# Patient Record
Sex: Female | Born: 1965 | Race: White | Hispanic: No | Marital: Married | Smoking: Never smoker
Health system: Southern US, Community
[De-identification: ages and names within clinical notes are randomized; demographics above are authoritative.]

## PROBLEM LIST (undated history)

## (undated) DIAGNOSIS — E039 Hypothyroidism, unspecified: Secondary | ICD-10-CM

## (undated) DIAGNOSIS — N200 Calculus of kidney: Secondary | ICD-10-CM

## (undated) HISTORY — PX: COLON SURGERY: SHX602

---

## 2013-01-03 ENCOUNTER — Encounter (HOSPITAL_BASED_OUTPATIENT_CLINIC_OR_DEPARTMENT_OTHER): Payer: Self-pay | Admitting: Emergency Medicine

## 2013-01-03 ENCOUNTER — Emergency Department (HOSPITAL_BASED_OUTPATIENT_CLINIC_OR_DEPARTMENT_OTHER)
Admission: EM | Admit: 2013-01-03 | Discharge: 2013-01-04 | Disposition: A | Discharge status: Home / Self Care | Payer: BC Managed Care – PPO | Attending: Emergency Medicine | Admitting: Emergency Medicine

## 2013-01-03 DIAGNOSIS — M545 Low back pain, unspecified: Secondary | ICD-10-CM | POA: Insufficient documentation

## 2013-01-03 DIAGNOSIS — M549 Dorsalgia, unspecified: Secondary | ICD-10-CM

## 2013-01-03 DIAGNOSIS — Z8639 Personal history of other endocrine, nutritional and metabolic disease: Secondary | ICD-10-CM | POA: Insufficient documentation

## 2013-01-03 DIAGNOSIS — Z862 Personal history of diseases of the blood and blood-forming organs and certain disorders involving the immune mechanism: Secondary | ICD-10-CM | POA: Insufficient documentation

## 2013-01-03 DIAGNOSIS — R51 Headache: Secondary | ICD-10-CM | POA: Insufficient documentation

## 2013-01-03 DIAGNOSIS — Z87442 Personal history of urinary calculi: Secondary | ICD-10-CM | POA: Insufficient documentation

## 2013-01-03 HISTORY — DX: Hypothyroidism, unspecified: E03.9

## 2013-01-03 HISTORY — DX: Calculus of kidney: N20.0

## 2013-01-03 NOTE — ED Notes (Signed)
PT sts headache which starts at base of posterior head and radiates up to top of head and to R eye; described as sharp; sts it si worse when sitting up; but also hurting so when lying that she can't sleep. Also c/o lower back pain starting this am. Pt sts chills at home. +Nausea, denies vomiting/diarrhea.

## 2013-01-04 ENCOUNTER — Emergency Department (HOSPITAL_BASED_OUTPATIENT_CLINIC_OR_DEPARTMENT_OTHER): Payer: BC Managed Care – PPO

## 2013-01-04 LAB — COMPREHENSIVE METABOLIC PANEL
ALT: 21 U/L (ref 0–35)
AST: 21 U/L (ref 0–37)
Albumin: 3.7 g/dL (ref 3.5–5.2)
Alkaline Phosphatase: 57 U/L (ref 39–117)
Chloride: 102 mEq/L (ref 96–112)
GFR calc Af Amer: >90 mL/min (ref >90)
Potassium: 3.2 mEq/L — ABNORMAL LOW (ref 3.5–5.1)
Sodium: 138 mEq/L (ref 135–145)
Total Bilirubin: 0.7 mg/dL (ref 0.3–1.2)
Total Protein: 6.7 g/dL (ref 6.0–8.3)

## 2013-01-04 LAB — CBC WITH DIFFERENTIAL/PLATELET
Basophils Absolute: 0 10*3/uL (ref 0.0–0.1)
Basophils Relative: 0 % (ref 0–1)
Hemoglobin: 11.7 g/dL — ABNORMAL LOW (ref 12.0–15.0)
Lymphocytes Relative: 20 % (ref 12–46)
MCHC: 32.1 g/dL (ref 30.0–36.0)
Monocytes Relative: 10 % (ref 3–12)
Neutro Abs: 2.5 10*3/uL (ref 1.7–7.7)
Neutrophils Relative %: 69 % (ref 43–77)
Platelets: 192 10*3/uL (ref 150–400)
RBC: 4.42 MIL/uL (ref 3.87–5.11)
RDW: 15.8 % — ABNORMAL HIGH (ref 11.5–15.5)

## 2013-01-04 LAB — URINALYSIS, ROUTINE W REFLEX MICROSCOPIC
Glucose, UA: NEGATIVE mg/dL
Hgb urine dipstick: NEGATIVE
Ketones, ur: NEGATIVE mg/dL
Leukocytes, UA: NEGATIVE
Nitrite: NEGATIVE
Protein, ur: NEGATIVE mg/dL
pH: 6.5 (ref 5.0–8.0)

## 2013-01-04 MED ORDER — DIPHENHYDRAMINE HCL 50 MG/ML IJ SOLN
25.0000 mg | Freq: Once | INTRAMUSCULAR | Status: AC
  Administered 2013-01-04: 25 mg via INTRAVENOUS
  Filled 2013-01-04: qty 1

## 2013-01-04 MED ORDER — DEXAMETHASONE SODIUM PHOSPHATE 10 MG/ML IJ SOLN
10.0000 mg | Freq: Once | INTRAMUSCULAR | Status: AC
  Administered 2013-01-04: 10 mg via INTRAVENOUS
  Filled 2013-01-04: qty 1

## 2013-01-04 MED ORDER — METOCLOPRAMIDE HCL 5 MG/ML IJ SOLN
10.0000 mg | Freq: Once | INTRAMUSCULAR | Status: AC
  Administered 2013-01-04: 10 mg via INTRAVENOUS
  Filled 2013-01-04: qty 2

## 2013-01-04 MED ORDER — KETOROLAC TROMETHAMINE 30 MG/ML IJ SOLN
30.0000 mg | Freq: Once | INTRAMUSCULAR | Status: AC
  Administered 2013-01-04: 30 mg via INTRAVENOUS
  Filled 2013-01-04: qty 1

## 2013-01-04 MED ORDER — SODIUM CHLORIDE 0.9 % IV BOLUS (SEPSIS)
1000.0000 mL | Freq: Once | INTRAVENOUS | Status: AC
  Administered 2013-01-04: 1000 mL via INTRAVENOUS

## 2013-01-04 NOTE — Discharge Instructions (Signed)
Ibuprofen 600 mg every 6 hours as needed for pain.  Return to the emergency department if her symptoms significantly worsen you develop high fevers, stiff neck, or other new or concerning symptoms.   Headaches, Frequently Asked Questions MIGRAINE HEADACHES Q: What is migraine? What causes it? How can I treat it? A: Generally, migraine headaches begin as a dull ache. Then they develop into a constant, throbbing, and pulsating pain. You may experience pain at the temples. You may experience pain at the front or back of one or both sides of the head. The pain is usually accompanied by a combination of:  Nausea.  Vomiting.  Sensitivity to light and noise. Some people (about 15%) experience an aura (see below) before an attack. The cause of migraine is believed to be chemical reactions in the brain. Treatment for migraine may include over-the-counter or prescription medications. It may also include self-help techniques. These include relaxation training and biofeedback.  Q: What is an aura? A: About 15% of people with migraine get an "aura". This is a sign of neurological symptoms that occur before a migraine headache. You may see wavy or jagged lines, dots, or flashing lights. You might experience tunnel vision or blind spots in one or both eyes. The aura can include visual or auditory hallucinations (something imagined). It may include disruptions in smell (such as strange odors), taste or touch. Other symptoms include:  Numbness.  A "pins and needles" sensation.  Difficulty in recalling or speaking the correct word. These neurological events may last as long as 60 minutes. These symptoms will fade as the headache begins. Q: What is a trigger? A: Certain physical or environmental factors can lead to or "trigger" a migraine. These include:  Foods.  Hormonal changes.  Weather.  Stress. It is important to remember that triggers are different for everyone. To help prevent migraine  attacks, you need to figure out which triggers affect you. Keep a headache diary. This is a good way to track triggers. The diary will help you talk to your healthcare professional about your condition. Q: Does weather affect migraines? A: Bright sunshine, hot, humid conditions, and drastic changes in barometric pressure may lead to, or "trigger," a migraine attack in some people. But studies have shown that weather does not act as a trigger for everyone with migraines. Q: What is the link between migraine and hormones? A: Hormones start and regulate many of your body's functions. Hormones keep your body in balance within a constantly changing environment. The levels of hormones in your body are unbalanced at times. Examples are during menstruation, pregnancy, or menopause. That can lead to a migraine attack. In fact, about three quarters of all women with migraine report that their attacks are related to the menstrual cycle.  Q: Is there an increased risk of stroke for migraine sufferers? A: The likelihood of a migraine attack causing a stroke is very remote. That is not to say that migraine sufferers cannot have a stroke associated with their migraines. In persons under age 47, the most common associated factor for stroke is migraine headache. But over the course of a person's normal life span, the occurrence of migraine headache may actually be associated with a reduced risk of dying from cerebrovascular disease due to stroke.  Q: What are acute medications for migraine? A: Acute medications are used to treat the pain of the headache after it has started. Examples over-the-counter medications, NSAIDs, ergots, and triptans.  Q: What are the triptans? A: Triptans  are the newest class of abortive medications. They are specifically targeted to treat migraine. Triptans are vasoconstrictors. They moderate some chemical reactions in the brain. The triptans work on receptors in your brain. Triptans help to  restore the balance of a neurotransmitter called serotonin. Fluctuations in levels of serotonin are thought to be a main cause of migraine.  Q: Are over-the-counter medications for migraine effective? A: Over-the-counter, or "OTC," medications may be effective in relieving mild to moderate pain and associated symptoms of migraine. But you should see your caregiver before beginning any treatment regimen for migraine.  Q: What are preventive medications for migraine? A: Preventive medications for migraine are sometimes referred to as "prophylactic" treatments. They are used to reduce the frequency, severity, and length of migraine attacks. Examples of preventive medications include antiepileptic medications, antidepressants, beta-blockers, calcium channel blockers, and NSAIDs (nonsteroidal anti-inflammatory drugs). Q: Why are anticonvulsants used to treat migraine? A: During the past few years, there has been an increased interest in antiepileptic drugs for the prevention of migraine. They are sometimes referred to as "anticonvulsants". Both epilepsy and migraine may be caused by similar reactions in the brain.  Q: Why are antidepressants used to treat migraine? A: Antidepressants are typically used to treat people with depression. They may reduce migraine frequency by regulating chemical levels, such as serotonin, in the brain.  Q: What alternative therapies are used to treat migraine? A: The term "alternative therapies" is often used to describe treatments considered outside the scope of conventional Western medicine. Examples of alternative therapy include acupuncture, acupressure, and yoga. Another common alternative treatment is herbal therapy. Some herbs are believed to relieve headache pain. Always discuss alternative therapies with your caregiver before proceeding. Some herbal products contain arsenic and other toxins. TENSION HEADACHES Q: What is a tension-type headache? What causes it? How can I  treat it? A: Tension-type headaches occur randomly. They are often the result of temporary stress, anxiety, fatigue, or anger. Symptoms include soreness in your temples, a tightening band-like sensation around your head (a "vice-like" ache). Symptoms can also include a pulling feeling, pressure sensations, and contracting head and neck muscles. The headache begins in your forehead, temples, or the back of your head and neck. Treatment for tension-type headache may include over-the-counter or prescription medications. Treatment may also include self-help techniques such as relaxation training and biofeedback. CLUSTER HEADACHES Q: What is a cluster headache? What causes it? How can I treat it? A: Cluster headache gets its name because the attacks come in groups. The pain arrives with little, if any, warning. It is usually on one side of the head. A tearing or bloodshot eye and a runny nose on the same side of the headache may also accompany the pain. Cluster headaches are believed to be caused by chemical reactions in the brain. They have been described as the most severe and intense of any headache type. Treatment for cluster headache includes prescription medication and oxygen. SINUS HEADACHES Q: What is a sinus headache? What causes it? How can I treat it? A: When a cavity in the bones of the face and skull (a sinus) becomes inflamed, the inflammation will cause localized pain. This condition is usually the result of an allergic reaction, a tumor, or an infection. If your headache is caused by a sinus blockage, such as an infection, you will probably have a fever. An x-ray will confirm a sinus blockage. Your caregiver's treatment might include antibiotics for the infection, as well as antihistamines or decongestants.  REBOUND HEADACHES Q: What is a rebound headache? What causes it? How can I treat it? A: A pattern of taking acute headache medications too often can lead to a condition known as "rebound  headache." A pattern of taking too much headache medication includes taking it more than 2 days per week or in excessive amounts. That means more than the label or a caregiver advises. With rebound headaches, your medications not only stop relieving pain, they actually begin to cause headaches. Doctors treat rebound headache by tapering the medication that is being overused. Sometimes your caregiver will gradually substitute a different type of treatment or medication. Stopping may be a challenge. Regularly overusing a medication increases the potential for serious side effects. Consult a caregiver if you regularly use headache medications more than 2 days per week or more than the label advises. ADDITIONAL QUESTIONS AND ANSWERS Q: What is biofeedback? A: Biofeedback is a self-help treatment. Biofeedback uses special equipment to monitor your body's involuntary physical responses. Biofeedback monitors:  Breathing.  Pulse.  Heart rate.  Temperature.  Muscle tension.  Brain activity. Biofeedback helps you refine and perfect your relaxation exercises. You learn to control the physical responses that are related to stress. Once the technique has been mastered, you do not need the equipment any more. Q: Are headaches hereditary? A: Four out of five (80%) of people that suffer report a family history of migraine. Scientists are not sure if this is genetic or a family predisposition. Despite the uncertainty, a child has a 50% chance of having migraine if one parent suffers. The child has a 75% chance if both parents suffer.  Q: Can children get headaches? A: By the time they reach high school, most young people have experienced some type of headache. Many safe and effective approaches or medications can prevent a headache from occurring or stop it after it has begun.  Q: What type of doctor should I see to diagnose and treat my headache? A: Start with your primary caregiver. Discuss his or her  experience and approach to headaches. Discuss methods of classification, diagnosis, and treatment. Your caregiver may decide to recommend you to a headache specialist, depending upon your symptoms or other physical conditions. Having diabetes, allergies, etc., may require a more comprehensive and inclusive approach to your headache. The National [employer] Foundation will provide, upon request, a list of [facility] physician members in your state. Document Released: 03/21/2003 Document Revised: 03/23/2011 Document Reviewed: 08/29/2007 [facility] Patient Information 2014 [place].

## 2013-01-04 NOTE — ED Provider Notes (Signed)
CSN: [id number]     Arrival date & time 01/03/13  2303 History   First MD Initiated Contact with Patient 01/04/13 0000     Chief Complaint  Patient presents with  . Headache   (Consider location/radiation/quality/duration/timing/severity/associated sxs/prior Treatment) HPI Comments: Patient is a 47 year old female is little significant medical history. She presents today with complaints of headache that has been occurring off and on for the past week and substantially worsened today. Her discomfort is located in the back of her head and radiates into her neck and top of her head. She denies any injury or trauma. She denies any visual complaints. She feels nauseated but denies any vomiting she has no fever and no stiff neck   Patient is a 47 y.o. female presenting with headaches. The history is provided by the patient.  Headache Pain location:  Occipital Quality:  Stabbing Radiates to:  Does not radiate Onset quality:  Gradual Duration:  2 days Timing:  Constant Progression:  Worsening Chronicity:  New Similar to prior headaches: no   Context: activity and bright light     Past Medical History  Diagnosis Date  . Kidney stone   . Hypothyroid    Past Surgical History  Procedure Laterality Date  . Colon surgery     No family history on file. History  Substance Use Topics  . Smoking status: Never Smoker   . Smokeless tobacco: Not on file  . Alcohol Use: No   OB History   Grav Para Term Preterm Abortions TAB SAB Ect Mult Living                 Review of Systems  Neurological: Positive for headaches.  All other systems reviewed and are negative.    Allergies  Stadol and Toradol  Home Medications  No current outpatient prescriptions on file. BP 146/103  Pulse 96  Temp(Src) 98.7 F (37.1 C) (Oral)  Resp 20  Ht 5\' 7"  (1.702 m)  Wt 173 lb (78.472 kg)  BMI 27.09 kg/m2  SpO2 98% Physical Exam  Nursing note and vitals reviewed. Constitutional: She is oriented to  person, place, and time. She appears well-developed and well-nourished. No distress.  HENT:  Head: Normocephalic and atraumatic.  Eyes: EOM are normal. Pupils are equal, round, and reactive to light.  There is no papilledema.  Neck: Normal range of motion. Neck supple.  Cardiovascular: Normal rate and regular rhythm.  Exam reveals no gallop and no friction rub.   No murmur heard. Pulmonary/Chest: Effort normal and breath sounds normal. No respiratory distress. She has no wheezes.  Abdominal: Soft. Bowel sounds are normal. She exhibits no distension. There is no tenderness.  Musculoskeletal: Normal range of motion. She exhibits no edema.  Lymphadenopathy:    She has no cervical adenopathy.  Neurological: She is alert and oriented to person, place, and time. No cranial nerve deficit. She exhibits normal muscle tone. Coordination normal.  Skin: Skin is warm and dry. She is not diaphoretic.    ED Course  Procedures (including critical care time) Labs Review Labs Reviewed  CBC WITH DIFFERENTIAL  COMPREHENSIVE METABOLIC PANEL  URINALYSIS, ROUTINE W REFLEX MICROSCOPIC   Imaging Review No results found.  EKG Interpretation    Date/Time:  Wednesday January 04 2013 00:24:28 EST Ventricular Rate:  66 PR Interval:  172 QRS Duration: 112 QT Interval:  432 QTC Calculation: 452 R Axis:   56 Text Interpretation:  Normal sinus rhythm with sinus arrhythmia Incomplete right bundle branch block Borderline  ECG Confirmed by DELOS  MD, Trayquan Kolakowski ([id number]) on 01/04/2013 2:06:49 AM            MDM  No diagnosis found. Patient is a 47 year old female presents with headache. Her neurologic exam is nonfocal however this is a very unusual headache for her. CT scan is unremarkable and blood work is unremarkable as well. She is feeling better with a migraine cocktail and I believe stable for discharge. She will return if her symptoms worsen or change.    Geoffery Lyons, MD 01/04/13 [phone]

## 2013-01-04 NOTE — ED Notes (Signed)
Ha off and on x 1 week w nausea

## 2015-07-23 IMAGING — CT CT HEAD W/O CM
1 series · 16 of 30 positions shown, 20 images · non-contrast
Comparison: None available

CLINICAL DATA: Severe headache

EXAM:
CT HEAD WITHOUT CONTRAST
TECHNIQUE: Contiguous axial images were obtained from the base of the skull
through the vertex without intravenous contrast.

[Series 2: head 4.8 h37s · axial · 0.45mm/px · z∈[-184,-51]mm · 16 of 32 slices shown, 20 images]
[im 2/32  brain]
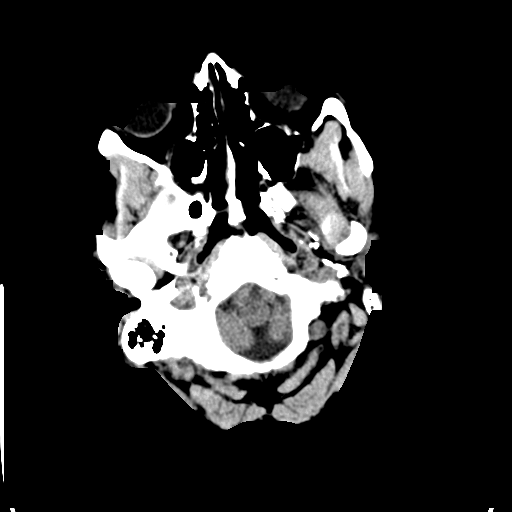
[im 2/32  bone]
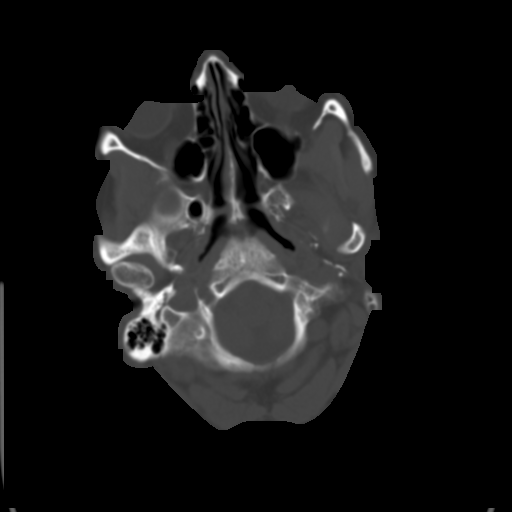
[im 4/32  brain]
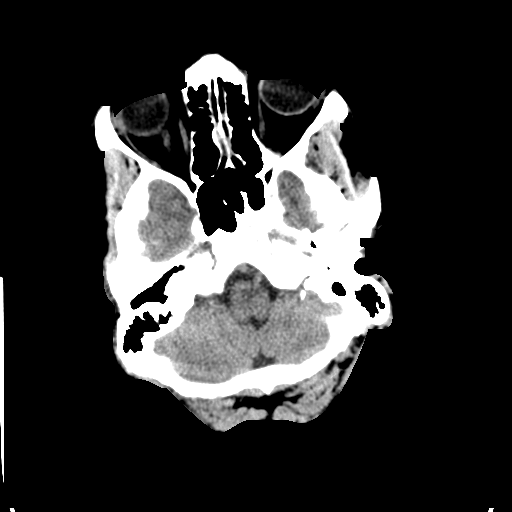
[im 6/32  brain]
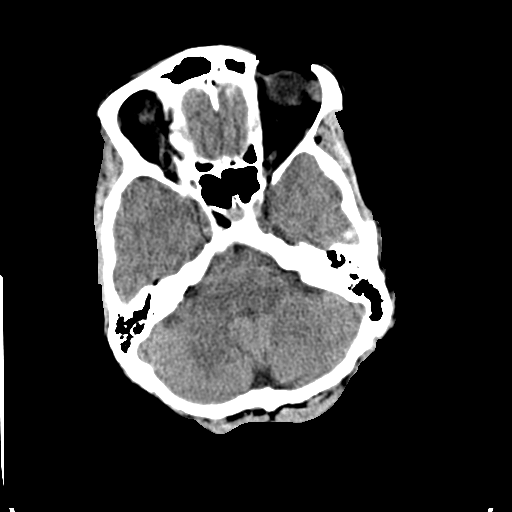
[im 8/32  brain]
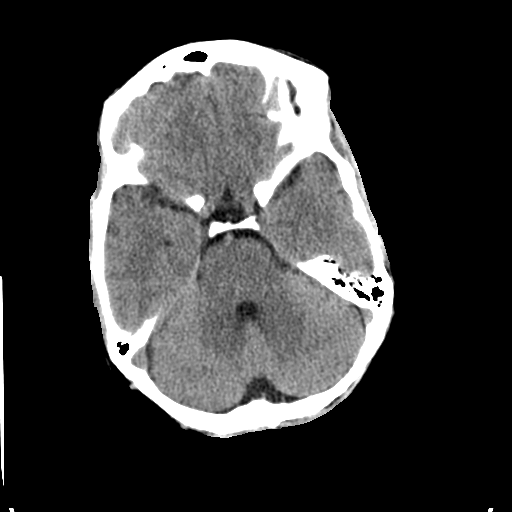
[im 9/32  brain]
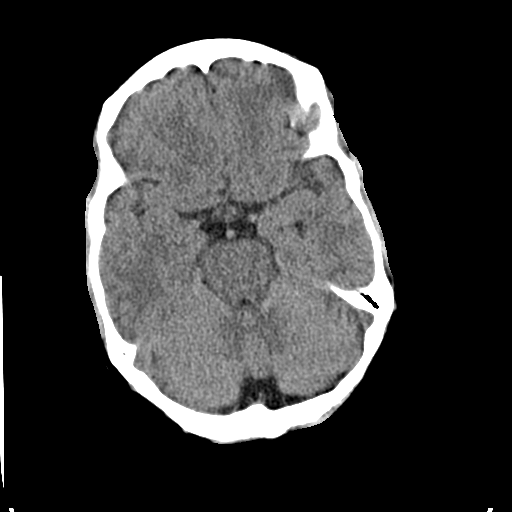
[im 9/32  bone]
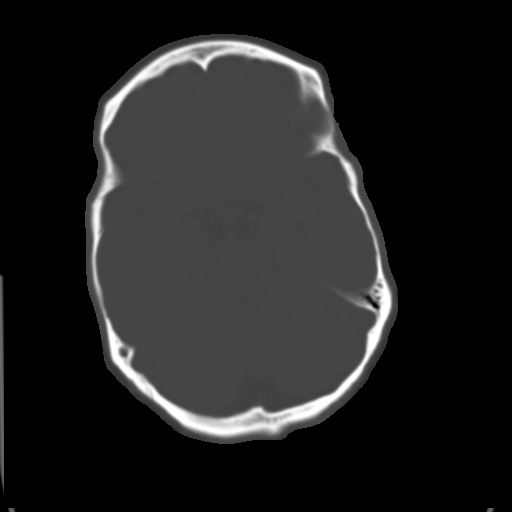
[im 11/32  brain]
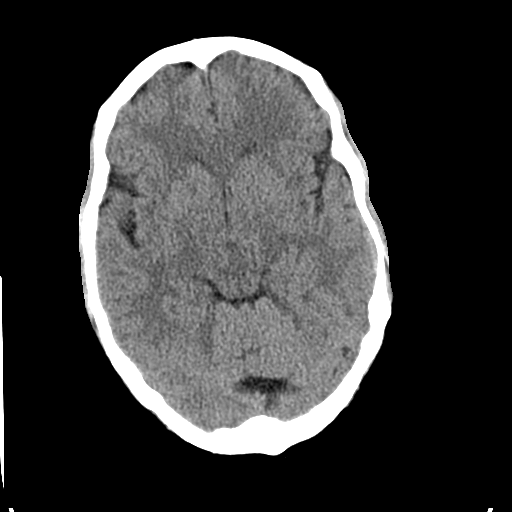
[im 13/32  brain]
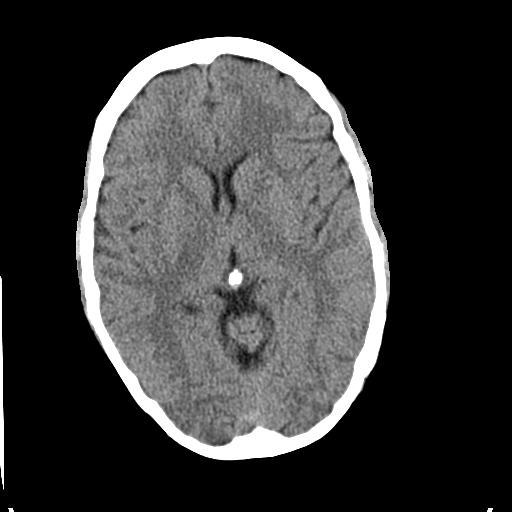
[im 15/32  brain]
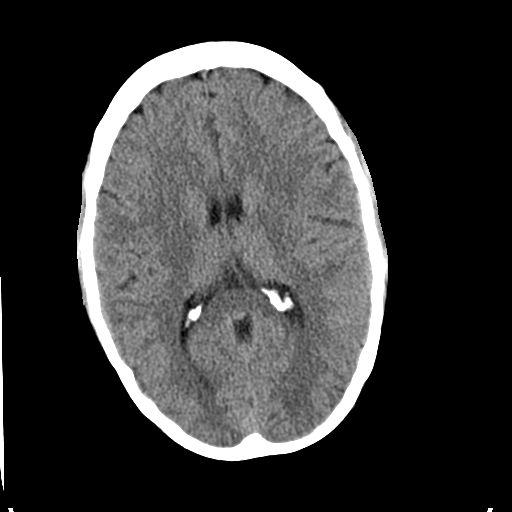
[im 17/32  brain]
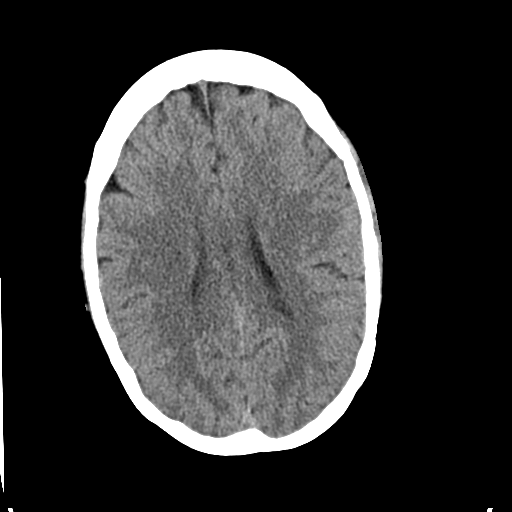
[im 17/32  bone]
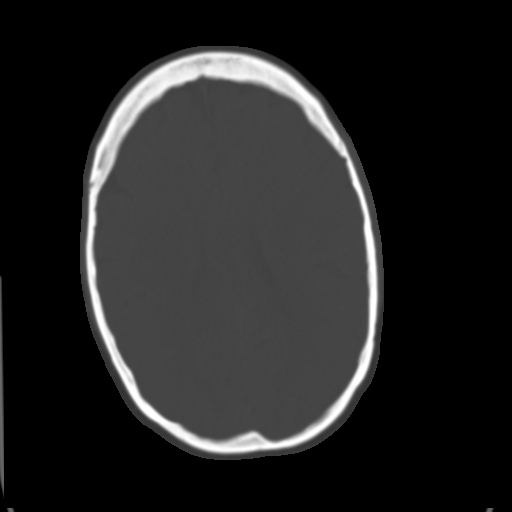
[im 19/32  brain]
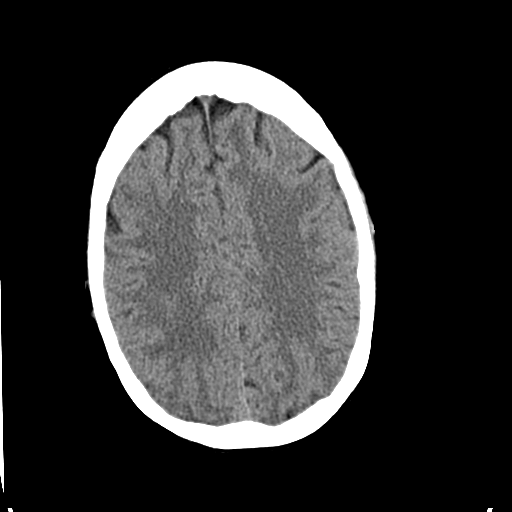
[im 21/32  brain]
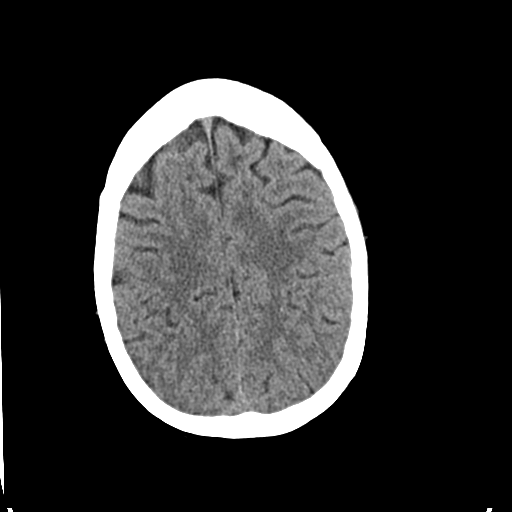
[im 23/32  brain]
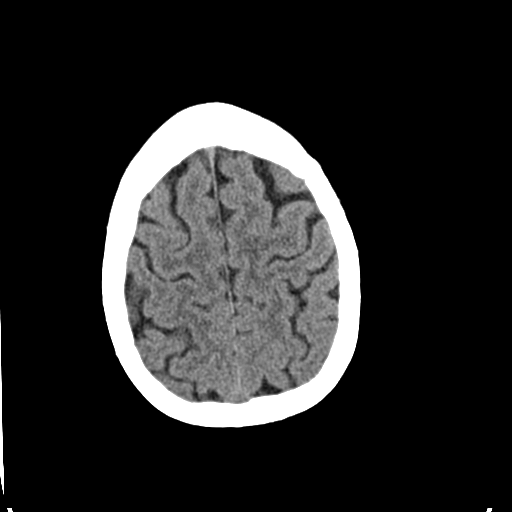
[im 24/32  brain]
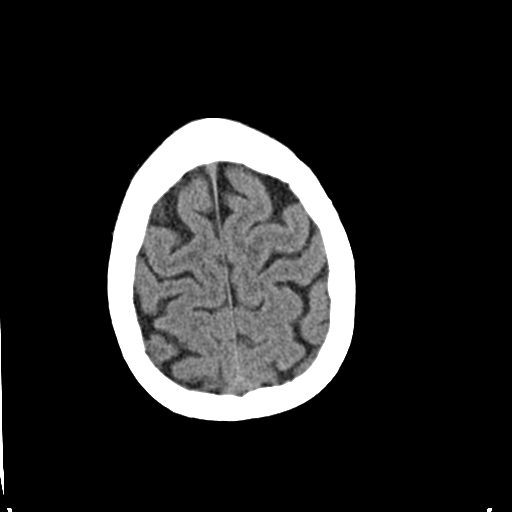
[im 24/32  bone]
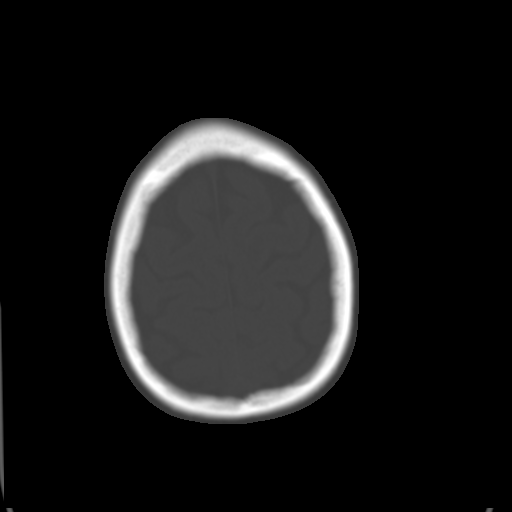
[im 26/32  brain]
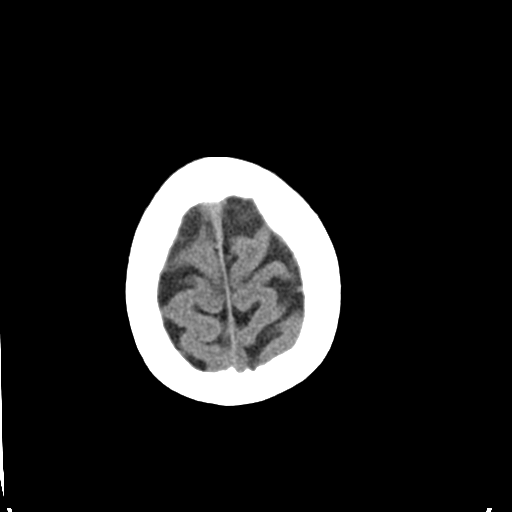
[im 28/32  brain]
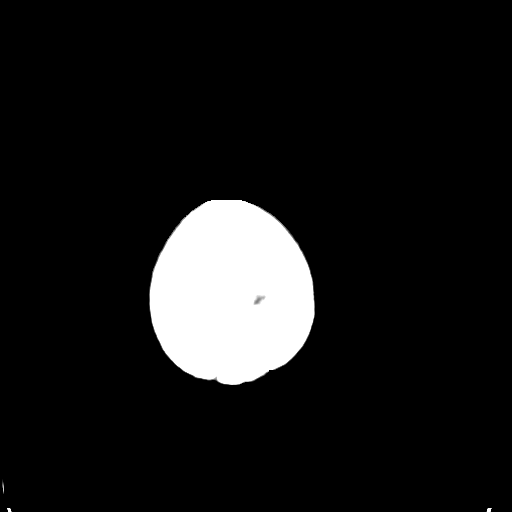
[im 30/32  brain]
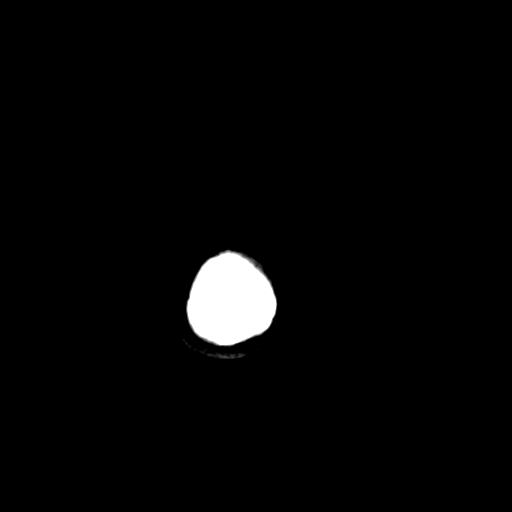

[16 of 30 positions shown; findings below may reference images not displayed]

FINDINGS: There is no acute intracranial hemorrhage or infarct. No mass lesion
or midline shift. Gray-white matter differentiation is well
maintained. Ventricles are normal in size without evidence of
hydrocephalus. CSF containing spaces are within normal limits. No
extra-axial fluid collection.

The calvarium is intact.

Orbital soft tissues are within normal limits.

The paranasal sinuses and mastoid air cells are well pneumatized and
free of fluid.

Scalp soft tissues are unremarkable.
IMPRESSION: No acute intracranial process.

## 2019-03-04 ENCOUNTER — Ambulatory Visit: Payer: Self-pay | Attending: Internal Medicine

## 2019-03-04 DIAGNOSIS — Z23 Encounter for immunization: Secondary | ICD-10-CM | POA: Insufficient documentation

## 2019-03-04 NOTE — Progress Notes (Signed)
   Covid-19 Vaccination Clinic  Name:  KIMYATTA LECY    MRN: [id number] DOB: 1965/06/17  03/04/2019  Ms. Ridgely was observed post Covid-19 immunization for 15 minutes without incidence. She was provided with Vaccine Information Sheet and instruction to access the V-Safe system.   Ms. Siemen was instructed to call 911 with any severe reactions post vaccine: Marland Kitchen Difficulty breathing  . Swelling of your face and throat  . A fast heartbeat  . A bad rash all over your body  . Dizziness and weakness    Immunizations Administered    Name Date Dose VIS Date Route   Pfizer COVID-19 Vaccine 03/04/2019  8:09 AM 0.3 mL 12/23/2018 Intramuscular   Manufacturer: [employer], [employer]   Lot: [id number]   NDC: [address]-7

## 2019-03-28 ENCOUNTER — Ambulatory Visit: Payer: Self-pay | Attending: Internal Medicine

## 2019-03-28 DIAGNOSIS — Z23 Encounter for immunization: Secondary | ICD-10-CM

## 2019-03-28 NOTE — Progress Notes (Signed)
   Covid-19 Vaccination Clinic  Name:  KAYLON LAROCHE    MRN: [id number] DOB: 1965-07-08  03/28/2019  Ms. Renfrow was observed post Covid-19 immunization for 15 minutes without incident. She was provided with Vaccine Information Sheet and instruction to access the V-Safe system.   Ms. Byers was instructed to call 911 with any severe reactions post vaccine: Marland Kitchen Difficulty breathing  . Swelling of face and throat  . A fast heartbeat  . A bad rash all over body  . Dizziness and weakness   Immunizations Administered    Name Date Dose VIS Date Route   Pfizer COVID-19 Vaccine 03/28/2019  8:31 AM 0.3 mL 12/23/2018 Intramuscular   Manufacturer: [employer], [employer]   Lot: [id number]   NDC: [address]-7
